# Patient Record
Sex: Female | Born: 1965 | Race: White | Hispanic: Yes | Marital: Married | Smoking: Never smoker
Health system: Southern US, Community
[De-identification: ages and names within clinical notes are randomized; demographics above are authoritative.]

## PROBLEM LIST (undated history)

## (undated) DIAGNOSIS — K115 Sialolithiasis: Secondary | ICD-10-CM

## (undated) HISTORY — PX: CHOLECYSTECTOMY: SHX55

---

## 2007-09-26 ENCOUNTER — Encounter: Admission: RE | Admit: 2007-09-26 | Discharge: 2007-09-26 | Payer: Self-pay | Admitting: Unknown Physician Specialty

## 2008-11-07 IMAGING — CT CT CHEST W/O CM
2 of 4 series · 15 of 36 positions shown, 18 images · non-contrast
Comparison: None available

CLINICAL DATA: Shortness of breath, chest pain, previous fall in
chest tenderness

CT CHEST WITHOUT CONTRAST
TECHNIQUE: Multidetector CT imaging of the chest was performed
following the standard protocol without IV contrast.

[Series 2: chest w/o · axial · non-contrast · 0.80mm/px · z∈[-322,-72]mm · 12 of 60 slices shown, 15 images]
[im 5/60  mediastinal]
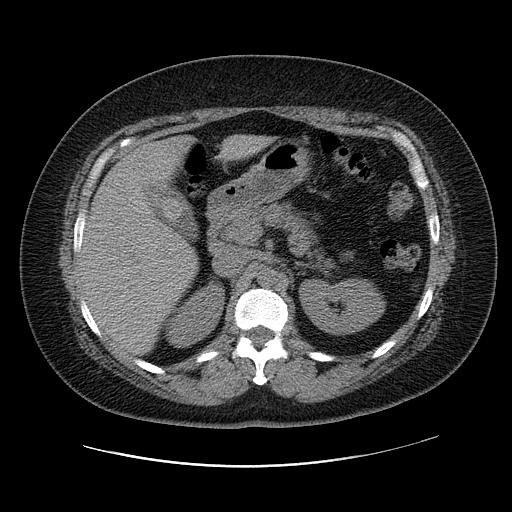
[im 5/60  lung]
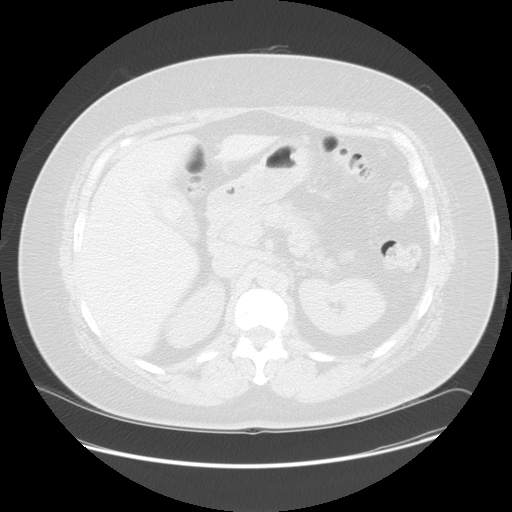
[im 10/60  lung]
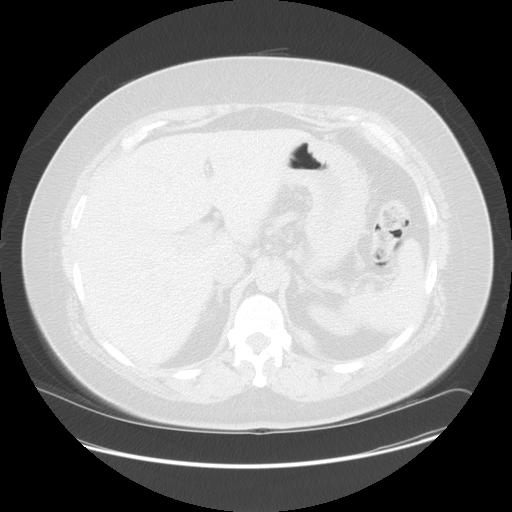
[im 14/60  lung]
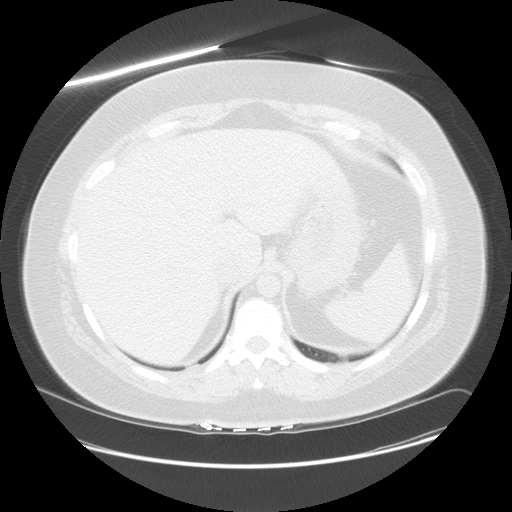
[im 19/60  lung]
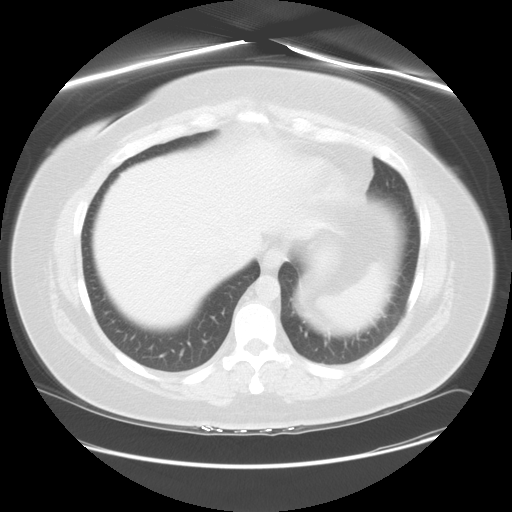
[im 23/60  mediastinal]
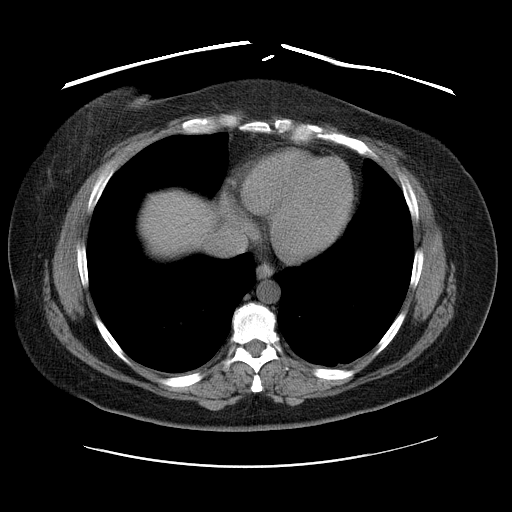
[im 23/60  lung]
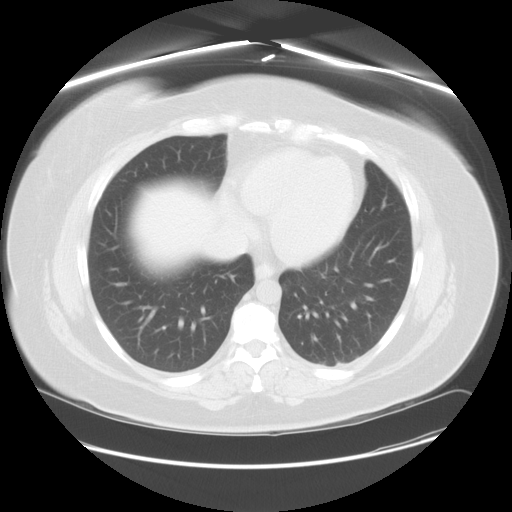
[im 28/60  lung]
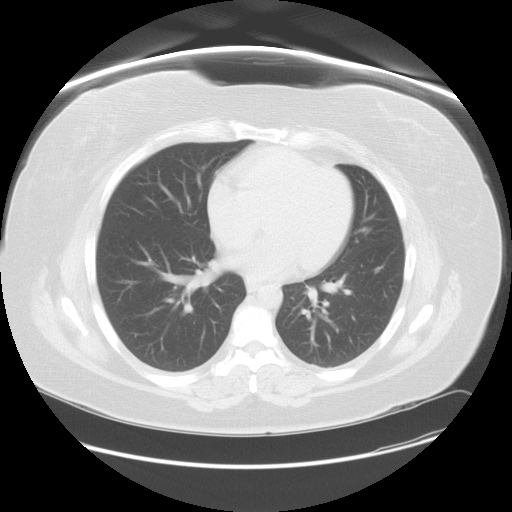
[im 32/60  lung]
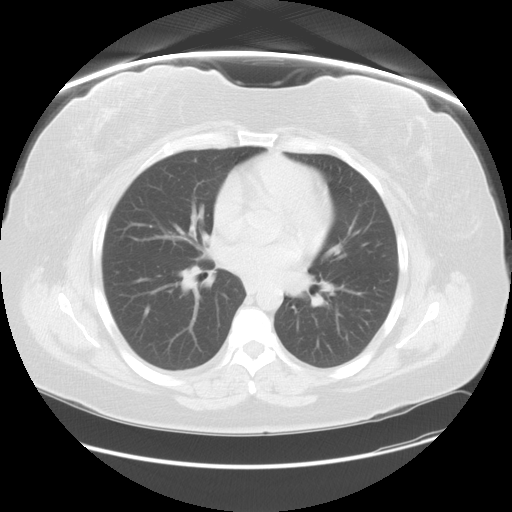
[im 37/60  lung]
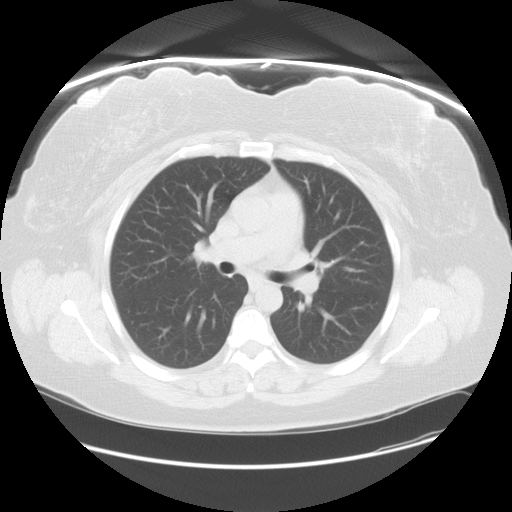
[im 41/60  mediastinal]
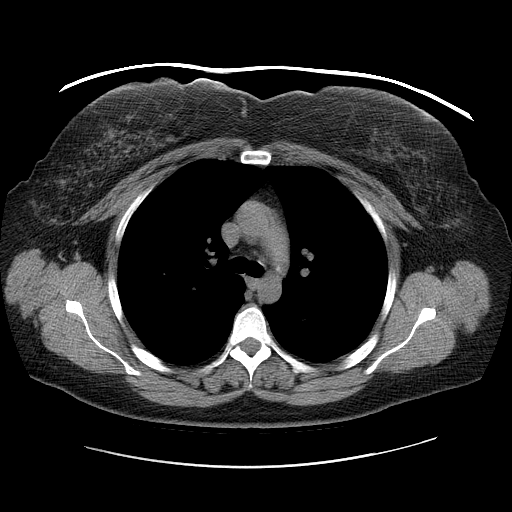
[im 41/60  lung]
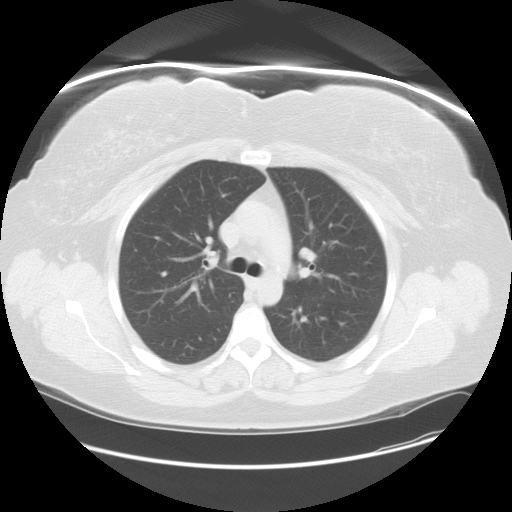
[im 46/60  lung]
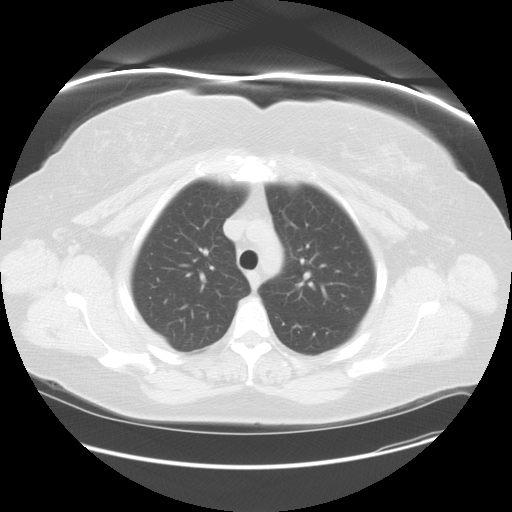
[im 50/60  lung]
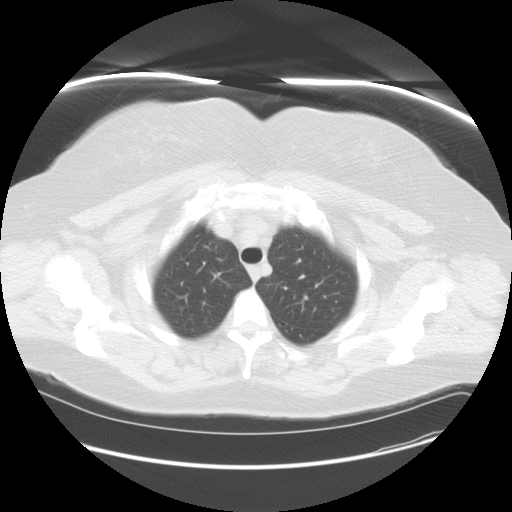
[im 55/60  lung]
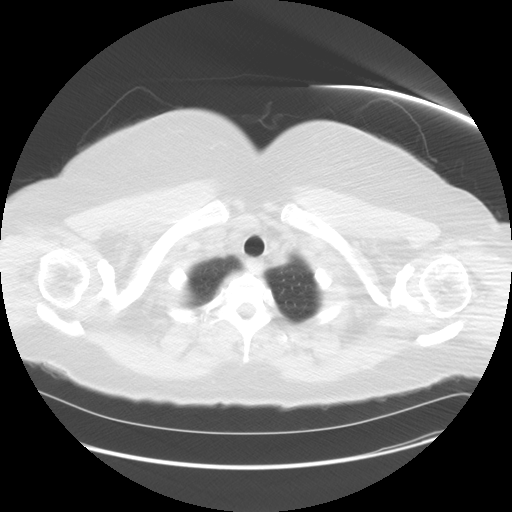

[Series 401: cor · coronal · 0.80mm/px · 3 of 118 slices shown]
[im 24/118  lung]
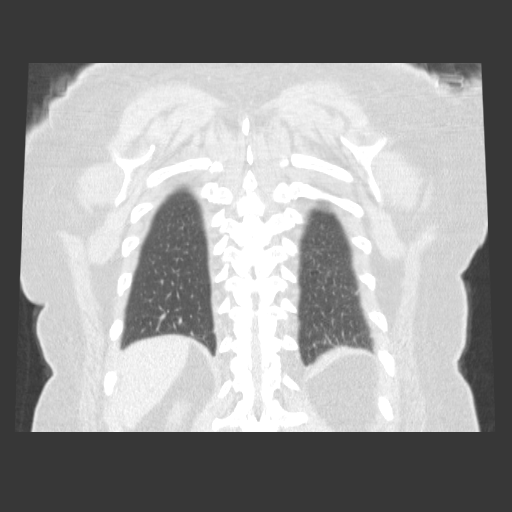
[im 47/118  lung]
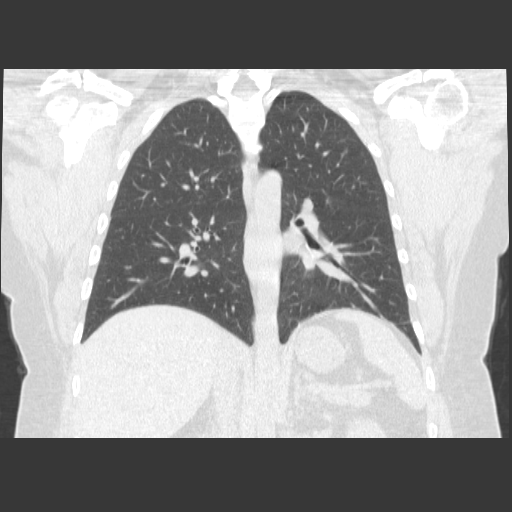
[im 71/118  lung]
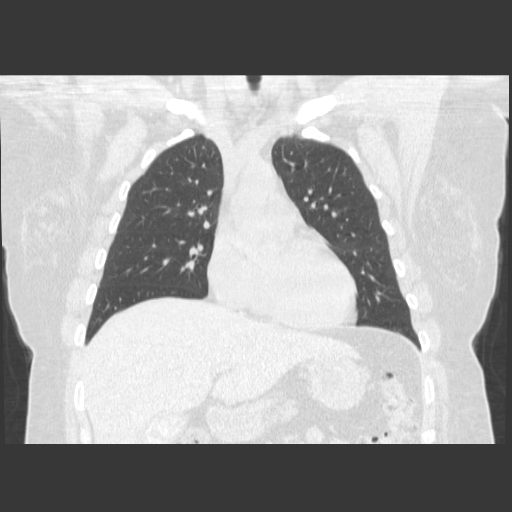

[15 of 36 positions shown; findings below may reference images not displayed]

FINDINGS: No evidence of pneumothorax, pleural effusion, or
parenchymal pulmonary abnormality.

No lymphadenopathy.

Limited view of the upper abdomen demonstrates three large 1.5 cm
on gallstones in the gallbladder.  Otherwise unremarkable.

Review of the bone windows demonstrates no acute  bony findings.
Particular attention directed towards the sternum.
IMPRESSION: 1..  No explanation for patient's chest pain.

2.  Three large 1.5 cm gallstones within the gallbladder.

## 2017-05-13 ENCOUNTER — Ambulatory Visit (INDEPENDENT_AMBULATORY_CARE_PROVIDER_SITE_OTHER): Payer: BC Managed Care – PPO

## 2017-05-13 ENCOUNTER — Other Ambulatory Visit: Payer: Self-pay | Admitting: Unknown Physician Specialty

## 2017-05-13 DIAGNOSIS — R109 Unspecified abdominal pain: Secondary | ICD-10-CM | POA: Diagnosis not present

## 2018-06-25 IMAGING — DX DG ABDOMEN 2V
3 series · 3 of 3 positions shown · non-contrast
Comparison: None.

CLINICAL DATA: Abdominal pain since last night. Intermittent for 1
year

EXAM:
ABDOMEN - 2 VIEW

[abdomen erect]
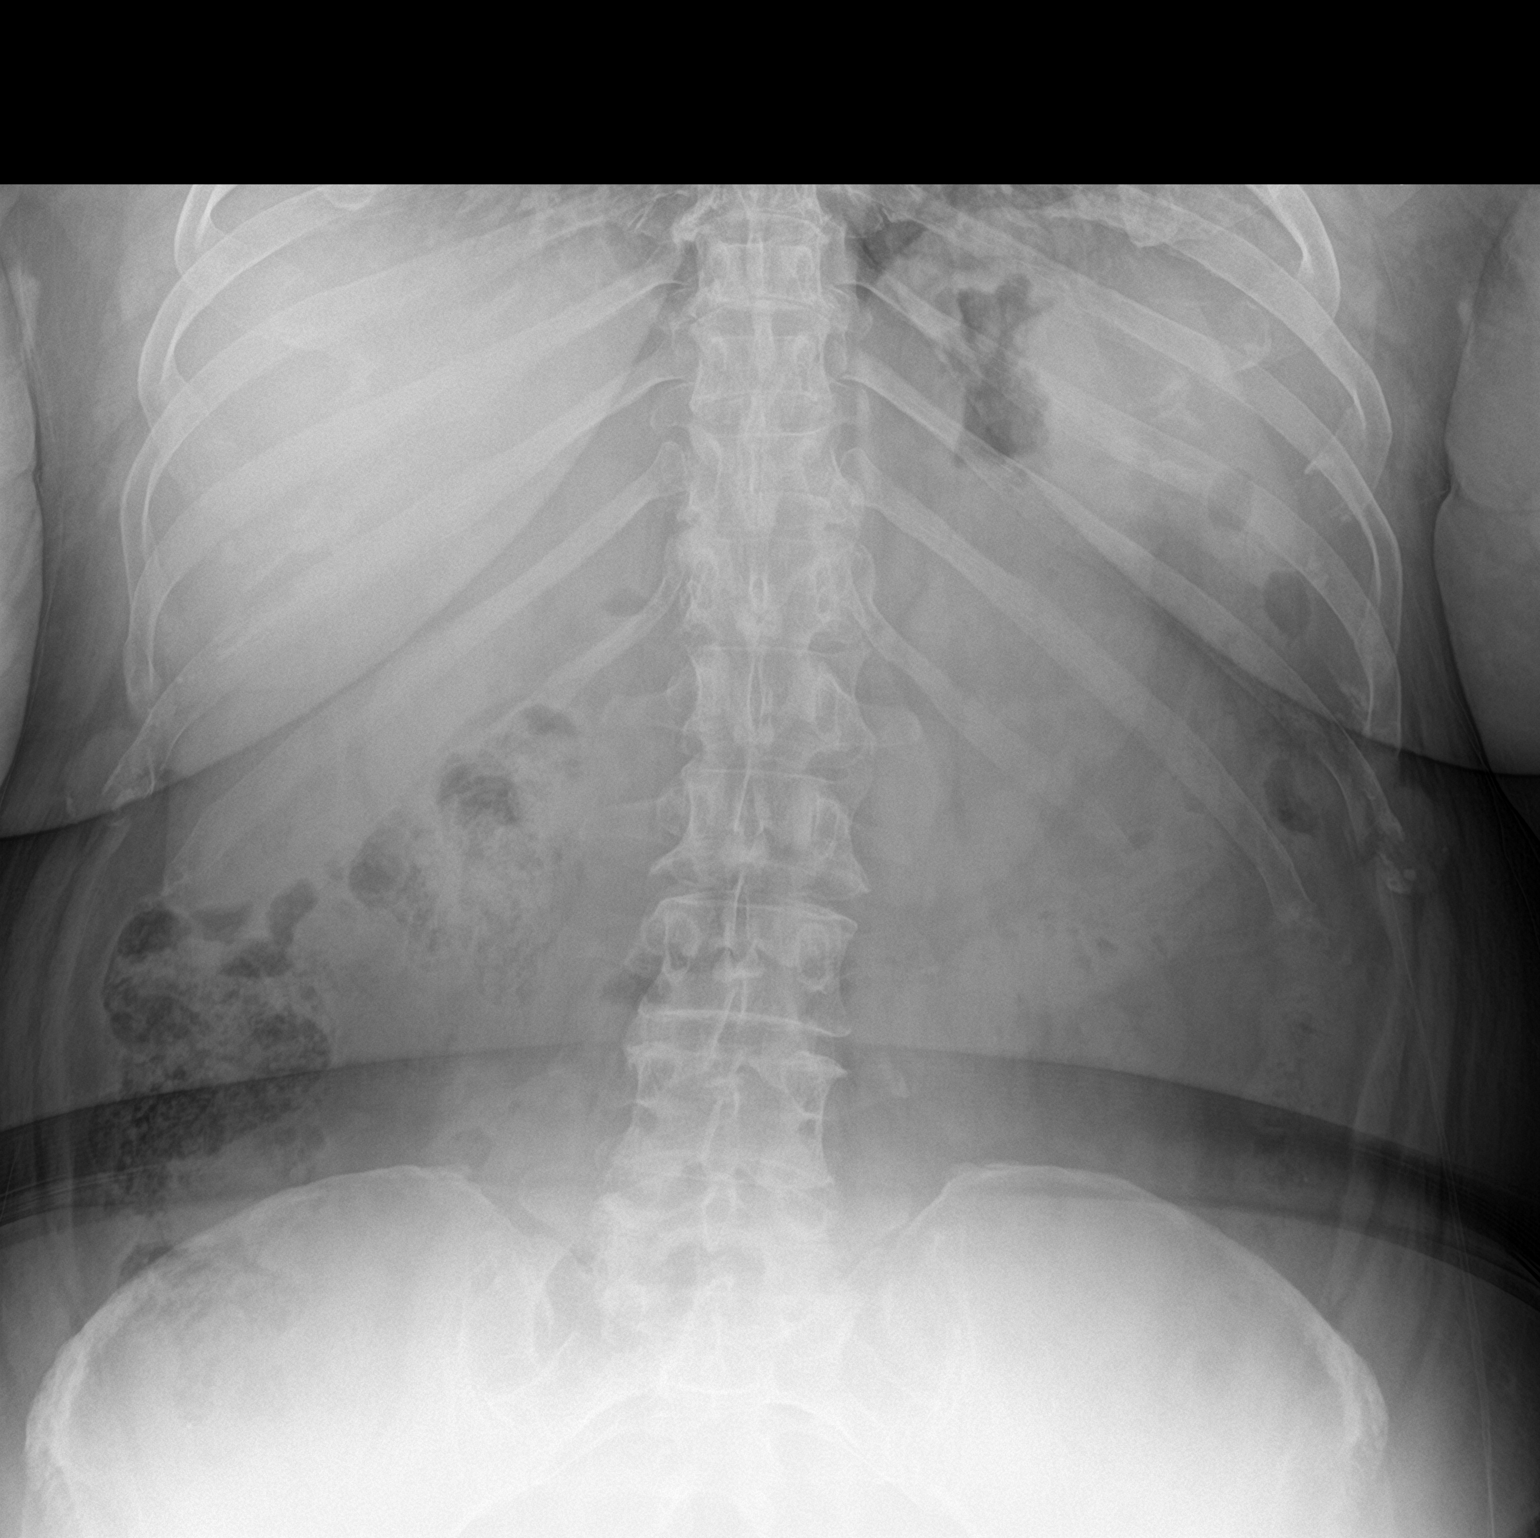

[abdomen supine (1 of 2)]
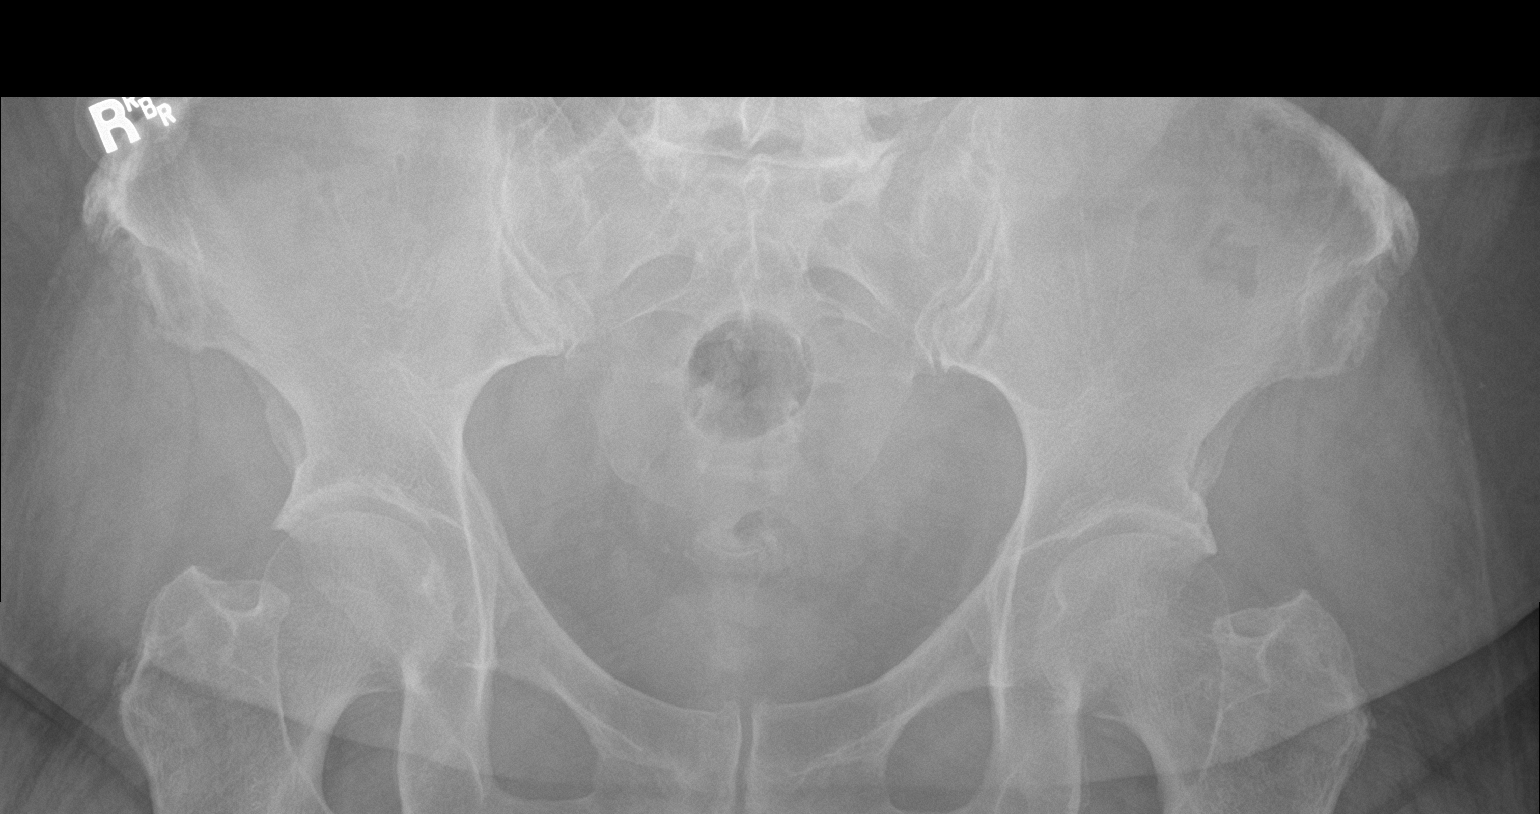

[abdomen supine (2 of 2)]
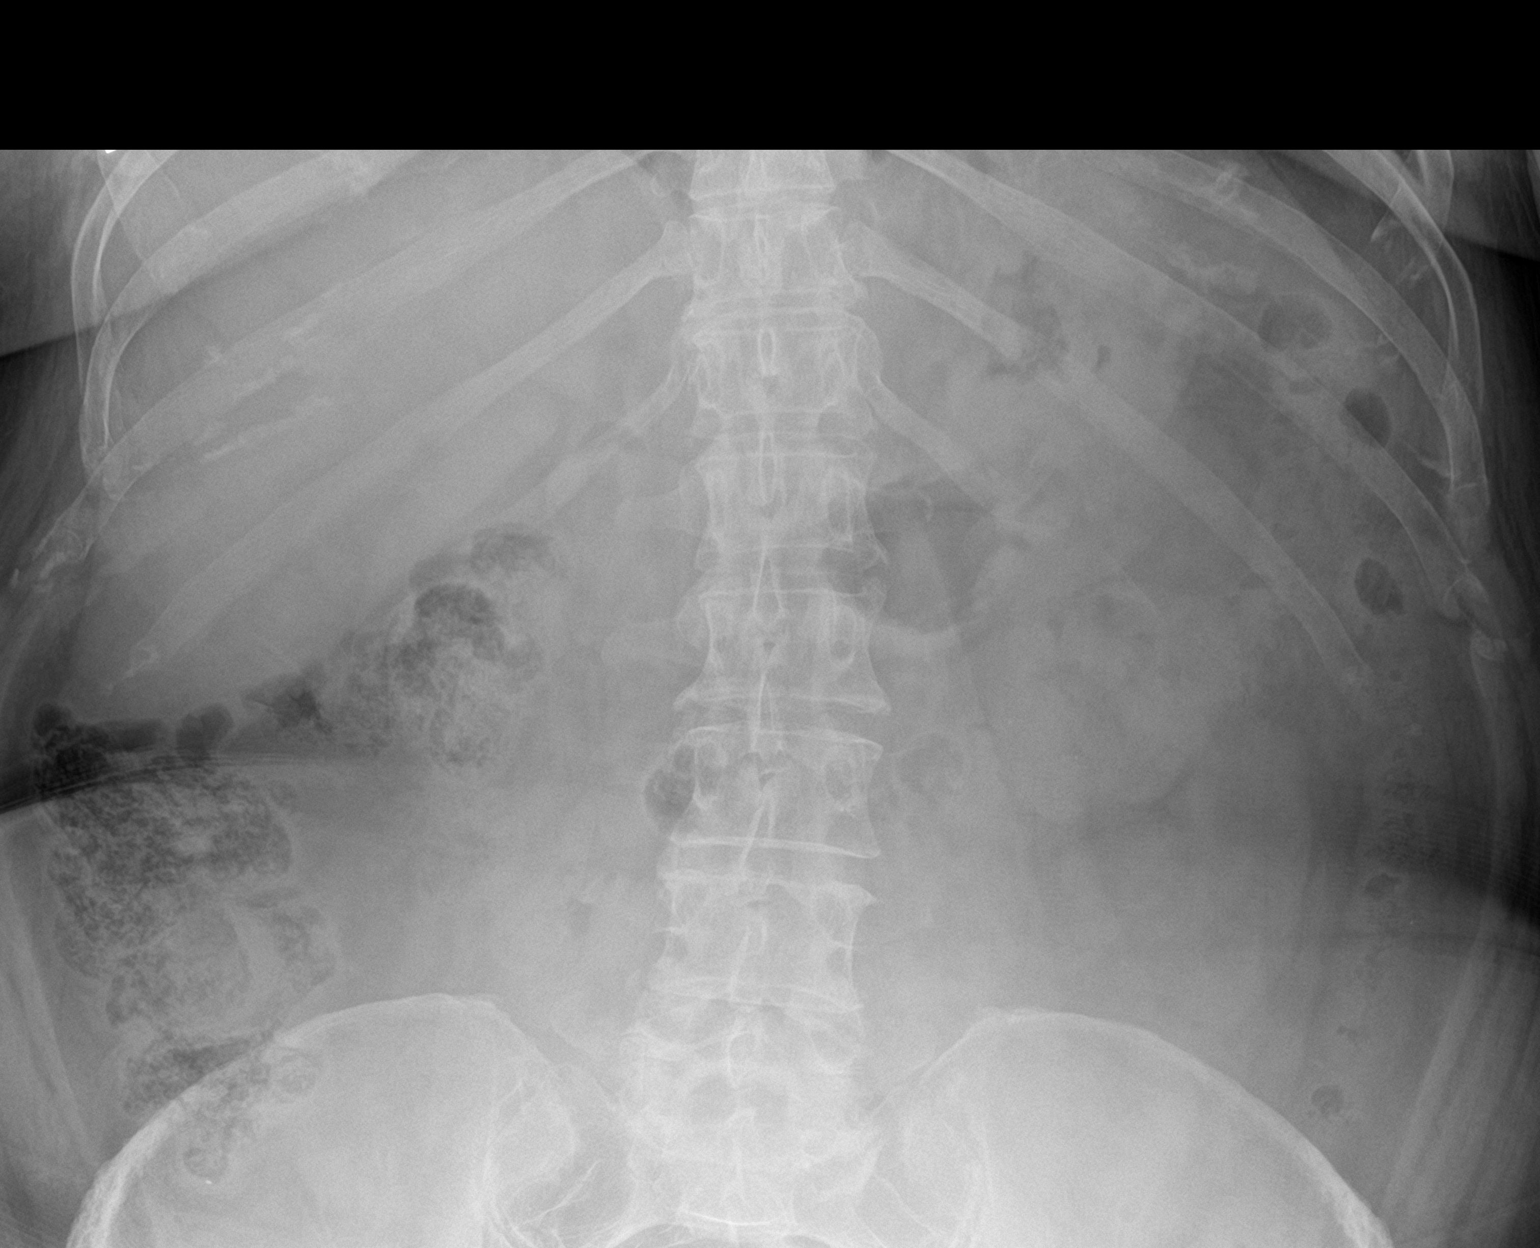

[3 of 3 positions shown; findings below may reference images not displayed]

FINDINGS: The bowel gas pattern is normal. There is no evidence of free air.
No radio-opaque calculi or other significant radiographic
abnormality is seen.
IMPRESSION: Negative.

## 2020-11-12 ENCOUNTER — Emergency Department
Admission: EM | Admit: 2020-11-12 | Discharge: 2020-11-12 | Disposition: A | Discharge status: Home / Self Care | Payer: BC Managed Care – PPO | Source: Home / Self Care | Attending: Family Medicine | Admitting: Family Medicine

## 2020-11-12 ENCOUNTER — Other Ambulatory Visit: Payer: Self-pay

## 2020-11-12 DIAGNOSIS — R310 Gross hematuria: Secondary | ICD-10-CM

## 2020-11-12 DIAGNOSIS — N3090 Cystitis, unspecified without hematuria: Secondary | ICD-10-CM

## 2020-11-12 DIAGNOSIS — N3091 Cystitis, unspecified with hematuria: Secondary | ICD-10-CM | POA: Diagnosis not present

## 2020-11-12 HISTORY — DX: Sialolithiasis: K11.5

## 2020-11-12 LAB — POCT URINALYSIS DIP (MANUAL ENTRY)
Glucose, UA: 100 mg/dL — AB
Nitrite, UA: POSITIVE — AB
Protein Ur, POC: >=300 mg/dL — AB
Spec Grav, UA: 1.015 (ref 1.010–1.025)
Urobilinogen, UA: 2 E.U./dL — AB
pH, UA: 6.5 (ref 5.0–8.0)

## 2020-11-12 MED ORDER — CEPHALEXIN 500 MG PO CAPS: 500.0000 mg | ORAL_CAPSULE | Freq: Two times a day (BID) | ORAL | 0 refills | Status: DC

## 2020-11-12 MED ORDER — FLUCONAZOLE 150 MG PO TABS: ORAL_TABLET | ORAL | 0 refills | Status: DC

## 2020-11-12 NOTE — ED Triage Notes (Signed)
Pt presents to Urgent Care with c/o hematuria x 2 days. Had dysuria and pelvic pain today.

## 2020-11-14 NOTE — ED Provider Notes (Signed)
[facility]    ASSESSMENT & PLAN:  1. Cystitis   2. Gross hematuria    Begin: Meds ordered this encounter  Medications  . cephALEXin (KEFLEX) 500 MG capsule    Sig: Take 1 capsule (500 mg total) by mouth 2 (two) times daily.    Dispense:  10 capsule    Refill:  0  . fluconazole (DIFLUCAN) 150 MG tablet    Sig: Take one tablet by mouth as a single dose. May repeat in 3 days if symptoms persist.    Dispense:  2 tablet    Refill:  0   No signs of pyelonephritis. Discussed. Urine culture sent. Diflucan at pt request. Will follow up with her PCP or here if not showing improvement over the next 48 hours, sooner if needed.  Outlined signs and symptoms indicating need for more acute intervention. Patient verbalized understanding. After Visit Summary given.  SUBJECTIVE:  Margaret Watson is a 55 y.o. female who complains of urinary frequency, urgency and dysuria for the past 2 d. Without associated flank pain, fever, chills, vaginal discharge or bleeding. Gross hematuria: present. No specific aggravating or alleviating factors reported. No LE edema. Normal PO intake without n/v/d. Without specific abdominal pain. Ambulatory without difficulty. OTC treatment: none PTA. H/O UTI: yes.  LMP: Patient's last menstrual period was 12/09/2016.  OBJECTIVE:  Vitals:   11/12/20 1331 11/12/20 1338  BP:  (!) 156/92  Pulse:  80  Resp:  20  Temp:  98.2 F (36.8 C)  TempSrc:  Oral  SpO2:  97%  Weight: 115.2 kg   Height: 5\' 3"  (1.6 m)    General appearance: alert; no distress HENT: oropharynx: moist Lungs: unlabored respirations Abdomen: soft Back: no CVA tenderness Extremities: no edema; symmetrical with no gross deformities Skin: warm and dry Neurologic: normal gait Psychological: alert and cooperative; normal mood and affect  Labs Reviewed  POCT URINALYSIS DIP (MANUAL ENTRY) - Abnormal; Notable for the following components:      Result Value   Color, UA red (*)     Clarity, UA turbid (*)    Glucose, UA =100 (*)    Bilirubin, UA moderate (*)    Ketones, POC UA small (15) (*)    Blood, UA large (*)    Protein Ur, POC >=300 (*)    Urobilinogen, UA 2.0 (*)    Nitrite, UA Positive (*)    Leukocytes, UA Large (3+) (*)    All other components within normal limits  URINE CULTURE    Allergies  Allergen Reactions  . Ibuprofen Swelling    Eye swelling    Past Medical History:  Diagnosis Date  . Salivary duct stone    Social History   Socioeconomic History  . Marital status: Married    Spouse name: Not on file  . Number of children: Not on file  . Years of education: Not on file  . Highest education level: Not on file  Occupational History  . Not on file  Tobacco Use  . Smoking status: Never Smoker  . Smokeless tobacco: Never Used  Vaping Use  . Vaping Use: Never used  Substance and Sexual Activity  . Alcohol use: Not Currently  . Drug use: Not Currently  . Sexual activity: Not on file  Other Topics Concern  . Not on file  Social History Narrative  . Not on file   Social Determinants of Health   Financial Resource Strain: Not on file  Food Insecurity: Not on file  Transportation Needs: Not on file  Physical Activity: Not on file  Stress: Not on file  Social Connections: Not on file  Intimate Partner Violence: Not on file   Family History  Problem Relation Age of Onset  . Hypertension Mother   . Diabetes Mother   . Diabetes Father   . Parkinson's disease Father   . Glaucoma Father        Mardella Layman, MD 11/14/20 [phone]

## 2020-11-15 LAB — URINE CULTURE
MICRO NUMBER:: 11970916
SPECIMEN QUALITY:: ADEQUATE

## 2020-11-16 ENCOUNTER — Telehealth (HOSPITAL_COMMUNITY): Payer: Self-pay | Admitting: Emergency Medicine

## 2020-11-16 MED ORDER — NITROFURANTOIN MONOHYD MACRO 100 MG PO CAPS: 100.0000 mg | ORAL_CAPSULE | Freq: Two times a day (BID) | ORAL | 0 refills | Status: DC

## 2021-03-03 ENCOUNTER — Emergency Department
Admission: EM | Admit: 2021-03-03 | Discharge: 2021-03-03 | Disposition: A | Discharge status: Home / Self Care | Payer: BC Managed Care – PPO | Source: Home / Self Care

## 2021-03-03 ENCOUNTER — Other Ambulatory Visit: Payer: Self-pay

## 2021-03-03 ENCOUNTER — Encounter: Payer: Self-pay | Admitting: Emergency Medicine

## 2021-03-03 DIAGNOSIS — N3001 Acute cystitis with hematuria: Secondary | ICD-10-CM | POA: Diagnosis not present

## 2021-03-03 DIAGNOSIS — R300 Dysuria: Secondary | ICD-10-CM

## 2021-03-03 MED ORDER — NITROFURANTOIN MONOHYD MACRO 100 MG PO CAPS: 100.0000 mg | ORAL_CAPSULE | Freq: Two times a day (BID) | ORAL | 0 refills | Status: AC

## 2021-03-03 MED ORDER — FLUCONAZOLE 150 MG PO TABS: ORAL_TABLET | ORAL | 0 refills | Status: AC

## 2021-03-03 NOTE — ED Provider Notes (Signed)
[facility]    CSN: [id number] Arrival date & time: 03/03/21  1611      History   Chief Complaint Chief Complaint  Patient presents with   Dysuria    HPI Margaret Watson is a 55 y.o. female.   HPI 55 year old female presents with urgency, dysuria, frequency and hematuria for 2 days.  Patient was evaluated here on 11/12/2020 for same symptoms.  Past Medical History:  Diagnosis Date   Salivary duct stone     There are no problems to display for this patient.   Past Surgical History:  Procedure Laterality Date   CESAREAN SECTION     CHOLECYSTECTOMY      OB History   No obstetric history on file.      Home Medications    Prior to Admission medications   Medication Sig Start Date End Date Taking? Authorizing Provider  Bacillus Coagulans-Inulin (PROBIOTIC) 1-250 BILLION-MG CAPS Take by mouth.   Yes [provider]  fluconazole (DIFLUCAN) 150 MG tablet Take 1 tab p.o. now, may repeat 1 tab p.o. in 3 days if symptoms have not resolved. 03/03/21  Yes Trevor Iha, FNP  Multiple Vitamin (MULTIVITAMIN) LIQD Take 5 mLs by mouth daily.   Yes [provider]  nitrofurantoin, macrocrystal-monohydrate, (MACROBID) 100 MG capsule Take 1 capsule (100 mg total) by mouth 2 (two) times daily for 7 days. 03/03/21 03/10/21 Yes Trevor Iha, FNP    Family History Family History  Problem Relation Age of Onset   Hypertension Mother    Diabetes Mother    Diabetes Father    Parkinson's disease Father    Glaucoma Father     Social History Social History   Tobacco Use   Smoking status: Never   Smokeless tobacco: Never  Vaping Use   Vaping Use: Never used  Substance Use Topics   Alcohol use: Not Currently   Drug use: Not Currently     Allergies   Ibuprofen   Review of Systems Review of Systems  Genitourinary:  Positive for dysuria and hematuria.  All other systems reviewed and are negative.   Physical Exam Triage Vital Signs ED  Triage Vitals  Enc Vitals Group     BP 03/03/21 1629 135/78     Pulse Rate 03/03/21 1629 79     Resp --      Temp 03/03/21 1629 98.5 F (36.9 C)     Temp Source 03/03/21 1629 Oral     SpO2 03/03/21 1629 97 %     Weight 03/03/21 1630 248 lb (112.5 kg)     Height 03/03/21 1630 5\' 3"  (1.6 m)     Head Circumference --      Peak Flow --      Pain Score 03/03/21 1630 6     Pain Loc --      Pain Edu? --      Excl. in GC? --    No data found.  Updated Vital Signs BP 135/78 (BP Location: Right Arm)   Pulse 79   Temp 98.5 F (36.9 C) (Oral)   Ht 5\' 3"  (1.6 m)   Wt 248 lb (112.5 kg)   LMP 12/09/2016   SpO2 97%   BMI 43.93 kg/m    Physical Exam Vitals and nursing note reviewed.  Constitutional:      General: She is not in acute distress.    Appearance: Normal appearance. She is obese. She is not ill-appearing.  HENT:     Head: Normocephalic and  atraumatic.     Mouth/Throat:     Mouth: Mucous membranes are moist.     Pharynx: Oropharynx is clear.  Eyes:     Extraocular Movements: Extraocular movements intact.     Conjunctiva/sclera: Conjunctivae normal.     Pupils: Pupils are equal, round, and reactive to light.  Cardiovascular:     Rate and Rhythm: Normal rate and regular rhythm.     Pulses: Normal pulses.     Heart sounds: Normal heart sounds.  Pulmonary:     Effort: Pulmonary effort is normal.     Breath sounds: Normal breath sounds.  Abdominal:     Tenderness: There is no right CVA tenderness or left CVA tenderness.  Musculoskeletal:        General: Normal range of motion.     Cervical back: Normal range of motion and neck supple. No tenderness.  Lymphadenopathy:     Cervical: No cervical adenopathy.  Skin:    General: Skin is warm and dry.  Neurological:     General: No focal deficit present.     Mental Status: She is alert and oriented to person, place, and time. Mental status is at baseline.  Psychiatric:        Mood and Affect: Mood normal.         Behavior: Behavior normal.     UC Treatments / Results  Labs (all labs ordered are listed, but only abnormal results are displayed) Labs Reviewed  URINE CULTURE    EKG   Radiology No results found.  Procedures Procedures (including critical care time)  Medications Ordered in UC Medications - No data to display  Initial Impression / Assessment and Plan / UC Course  I have reviewed the triage vital signs and the nursing notes.  Pertinent labs & imaging results that were available during my care of the patient were reviewed by me and considered in my medical decision making (see chart for details).    MDM: 1.  Acute cystitis with hematuria-Rx'd Macrobid and Diflucan, urine culture ordered. Advised patient to take medication as directed with food to completion.  Advised/encouraged patient we will follow-up with urine culture results once received.  Encouraged patient to increase daily water intake while taking these medications.  Patient discharged home, hemodynamically stable. Final Clinical Impressions(s) / UC Diagnoses   Final diagnoses:  Dysuria  Acute cystitis with hematuria     Discharge Instructions      Advised patient to take medication as directed with food to completion.  Advised/encouraged patient we will follow-up with urine culture results once received.  Encouraged patient to increase daily water intake while taking these medications.     ED Prescriptions     Medication Sig Dispense Auth. Provider   nitrofurantoin, macrocrystal-monohydrate, (MACROBID) 100 MG capsule Take 1 capsule (100 mg total) by mouth 2 (two) times daily for 7 days. 14 capsule Trevor Iha, FNP   fluconazole (DIFLUCAN) 150 MG tablet Take 1 tab p.o. now, may repeat 1 tab p.o. in 3 days if symptoms have not resolved. 3 tablet Trevor Iha, FNP      PDMP not reviewed this encounter.   Trevor Iha, FNP 03/03/21 1729

## 2021-03-03 NOTE — Discharge Instructions (Addendum)
Advised patient to take medication as directed with food to completion.  Advised/encouraged patient we will follow-up with urine culture results once received.  Encouraged patient to increase daily water intake while taking these medications.

## 2021-03-03 NOTE — ED Triage Notes (Signed)
Patient c/o urgency, dysuria, frequency and hematuria since yesterday.  Patient has taken Tylenol for the dysuria.

## 2021-03-05 LAB — URINE CULTURE
MICRO NUMBER:: 12417664
SPECIMEN QUALITY:: ADEQUATE

## 2022-02-03 ENCOUNTER — Ambulatory Visit
Admission: EM | Admit: 2022-02-03 | Discharge: 2022-02-03 | Disposition: A | Discharge status: Home / Self Care | Payer: BC Managed Care – PPO | Attending: Family Medicine | Admitting: Family Medicine

## 2022-02-03 ENCOUNTER — Other Ambulatory Visit: Payer: Self-pay

## 2022-02-03 DIAGNOSIS — H109 Unspecified conjunctivitis: Secondary | ICD-10-CM | POA: Diagnosis not present

## 2022-02-03 MED ORDER — MOXIFLOXACIN HCL 0.5 % OP SOLN: 1.0000 [drp] | Freq: Three times a day (TID) | OPHTHALMIC | 0 refills | Status: AC

## 2022-02-03 NOTE — Discharge Instructions (Addendum)
Advised patient to instill eyedrops into right eye as directed.  Advised patient if left eye develops similar symptoms to please treat left eye as well with Vigamox.  Advised patient may use OTC blink tears between Vigamox instillation for dry or irritated eye.  Advised if eye pain worsens please follow-up with ophthalmologist or go to nearest ED for immediate evaluation.

## 2022-02-03 NOTE — ED Provider Notes (Signed)
[facility]    CSN: [id number] Arrival date & time: 02/03/22  0803      History   Chief Complaint Chief Complaint  Patient presents with   Eye Pain    Right eye itching, pain    HPI Glorya Bartley is a 56 y.o. female.   HPI Pleasant 56 year old female presents with right eye pain and irritation, swollen, discharge onset was last night.  Patient reports attempting/trying saline solution and Visine last night with little to no relief.  PMH significant for obesity and salivary duct stone.  Past Medical History:  Diagnosis Date   Salivary duct stone     There are no problems to display for this patient.   Past Surgical History:  Procedure Laterality Date   CESAREAN SECTION     CHOLECYSTECTOMY      OB History   No obstetric history on file.      Home Medications    Prior to Admission medications   Medication Sig Start Date End Date Taking? Authorizing Provider  moxifloxacin (VIGAMOX) 0.5 % ophthalmic solution Place 1 drop into the right eye 3 (three) times daily for 7 days. 02/03/22 02/10/22 Yes Trevor Iha, FNP  Multiple Vitamin (MULTIVITAMIN) LIQD Take 5 mLs by mouth daily.   Yes [provider]  Bacillus Coagulans-Inulin (PROBIOTIC) 1-250 BILLION-MG CAPS Take by mouth.    [provider]  fluconazole (DIFLUCAN) 150 MG tablet Take 1 tab p.o. now, may repeat 1 tab p.o. in 3 days if symptoms have not resolved. 03/03/21   Trevor Iha, FNP    Family History Family History  Problem Relation Age of Onset   Hypertension Mother    Diabetes Mother    Diabetes Father    Parkinson's disease Father    Glaucoma Father     Social History Social History   Tobacco Use   Smoking status: Never   Smokeless tobacco: Never  Vaping Use   Vaping Use: Never used  Substance Use Topics   Alcohol use: Not Currently   Drug use: Not Currently     Allergies   Ibuprofen   Review of Systems Review of Systems  Eyes:  Positive for pain and  redness.  All other systems reviewed and are negative.    Physical Exam Triage Vital Signs ED Triage Vitals  Enc Vitals Group     BP 02/03/22 0820 (!) 142/88     Pulse Rate 02/03/22 0820 71     Resp 02/03/22 0820 18     Temp 02/03/22 0820 98.1 F (36.7 C)     Temp Source 02/03/22 0820 Oral     SpO2 02/03/22 0820 95 %     Weight 02/03/22 0823 250 lb (113.4 kg)     Height --      Head Circumference --      Peak Flow --      Pain Score 02/03/22 0829 7     Pain Loc --      Pain Edu? --      Excl. in GC? --    No data found.  Updated Vital Signs BP (!) 142/88 (BP Location: Right Arm)   Pulse 71   Temp 98.1 F (36.7 C) (Oral)   Resp 18   Wt 250 lb (113.4 kg)   LMP 12/09/2016   SpO2 95%   BMI 44.29 kg/m    Physical Exam Vitals and nursing note reviewed.  Constitutional:      General: She is not in acute distress.  Appearance: Normal appearance. She is obese. She is not ill-appearing.  HENT:     Head: Normocephalic and atraumatic.     Mouth/Throat:     Mouth: Mucous membranes are moist.     Pharynx: Oropharynx is clear.  Eyes:     Extraocular Movements: Extraocular movements intact.     Conjunctiva/sclera: Conjunctivae normal.     Pupils: Pupils are equal, round, and reactive to light.     Comments: Right eye: Sclera with +4 injection; right I initially irrigated with saline solution/eyewash and inspected no foreign bodies noted; 4 drops of tetracaine hydrochloride instilled into right eye; after several minutes right eyelid inverted inspected, no foreign bodies identified; fluorescein strip used to check for corneal abrasion no corneal abrasion noted; patient tolerated procedure well without complication.  Cardiovascular:     Rate and Rhythm: Normal rate and regular rhythm.     Pulses: Normal pulses.     Heart sounds: Normal heart sounds. No murmur heard. Pulmonary:     Effort: Pulmonary effort is normal.     Breath sounds: Normal breath sounds. No wheezing,  rhonchi or rales.  Musculoskeletal:     Cervical back: Normal range of motion and neck supple.  Skin:    General: Skin is warm and dry.  Neurological:     General: No focal deficit present.     Mental Status: She is alert and oriented to person, place, and time.      UC Treatments / Results  Labs (all labs ordered are listed, but only abnormal results are displayed) Labs Reviewed - No data to display  EKG   Radiology No results found.  Procedures Procedures (including critical care time)  Medications Ordered in UC Medications - No data to display  Initial Impression / Assessment and Plan / UC Course  I have reviewed the triage vital signs and the nursing notes.  Pertinent labs & imaging results that were available during my care of the patient were reviewed by me and considered in my medical decision making (see chart for details).     MDM: 1.  Conjunctivitis of right eye, unspecified conjunctivitis type-Rx'd Vigamox. Advised patient to instill eyedrops into right eye as directed.  Advised patient if left eye develops similar symptoms to please treat left eye as well with Vigamox.  Advised patient may use OTC blink tears between Vigamox instillation for dry or irritated eye.  Advised if eye pain worsens please follow-up with ophthalmologist or go to nearest ED for immediate evaluation.  Patient discharged home, hemodynamically stable. Final Clinical Impressions(s) / UC Diagnoses   Final diagnoses:  Conjunctivitis of right eye, unspecified conjunctivitis type     Discharge Instructions      Advised patient to instill eyedrops into right eye as directed.  Advised patient if left eye develops similar symptoms to please treat left eye as well with Vigamox.  Advised patient may use OTC blink tears between Vigamox instillation for dry or irritated eye.  Advised if eye pain worsens please follow-up with ophthalmologist or go to nearest ED for immediate evaluation.     ED  Prescriptions     Medication Sig Dispense Auth. Provider   moxifloxacin (VIGAMOX) 0.5 % ophthalmic solution Place 1 drop into the right eye 3 (three) times daily for 7 days. 3 mL Trevor Iha, FNP      PDMP not reviewed this encounter.   Trevor Iha, FNP 02/03/22 [phone]

## 2022-02-03 NOTE — ED Triage Notes (Signed)
Pt c/o right eye pain and irritation, swollen, discharge onset since last night. Pt tried saline solution and visine last night
